# Patient Record
Sex: Female | Born: 1990 | Race: White | Hispanic: No | Marital: Single | Smoking: Never smoker
Health system: Southern US, Community
[De-identification: ages and names within clinical notes are randomized; demographics above are authoritative.]

## PROBLEM LIST (undated history)

## (undated) HISTORY — PX: APPENDECTOMY: SHX54

---

## 2017-08-19 ENCOUNTER — Encounter (HOSPITAL_COMMUNITY): Payer: Self-pay | Admitting: Emergency Medicine

## 2017-08-19 ENCOUNTER — Emergency Department (HOSPITAL_COMMUNITY): Payer: Commercial Managed Care - PPO

## 2017-08-19 ENCOUNTER — Emergency Department (HOSPITAL_COMMUNITY)
Admission: EM | Admit: 2017-08-19 | Discharge: 2017-08-19 | Disposition: A | Discharge status: Home / Self Care | Payer: Commercial Managed Care - PPO | Attending: Emergency Medicine | Admitting: Emergency Medicine

## 2017-08-19 DIAGNOSIS — N760 Acute vaginitis: Secondary | ICD-10-CM | POA: Diagnosis not present

## 2017-08-19 DIAGNOSIS — R102 Pelvic and perineal pain: Secondary | ICD-10-CM | POA: Diagnosis present

## 2017-08-19 DIAGNOSIS — Z79899 Other long term (current) drug therapy: Secondary | ICD-10-CM | POA: Diagnosis not present

## 2017-08-19 DIAGNOSIS — N898 Other specified noninflammatory disorders of vagina: Secondary | ICD-10-CM

## 2017-08-19 DIAGNOSIS — B9689 Other specified bacterial agents as the cause of diseases classified elsewhere: Secondary | ICD-10-CM

## 2017-08-19 LAB — CBC
HEMATOCRIT: 42.6 % (ref 36.0–46.0)
HEMOGLOBIN: 14.1 g/dL (ref 12.0–15.0)
MCH: 30.9 pg (ref 26.0–34.0)
MCHC: 33.1 g/dL (ref 30.0–36.0)
MCV: 93.2 fL (ref 78.0–100.0)
Platelets: 225 10*3/uL (ref 150–400)
RBC: 4.57 MIL/uL (ref 3.87–5.11)
RDW: 12.3 % (ref 11.5–15.5)
WBC: 12.2 10*3/uL — ABNORMAL HIGH (ref 4.0–10.5)

## 2017-08-19 LAB — URINALYSIS, ROUTINE W REFLEX MICROSCOPIC
Bilirubin Urine: NEGATIVE
Glucose, UA: NEGATIVE mg/dL
HGB URINE DIPSTICK: NEGATIVE
Ketones, ur: 20 mg/dL — AB
LEUKOCYTES UA: NEGATIVE
Nitrite: NEGATIVE
Protein, ur: NEGATIVE mg/dL
SPECIFIC GRAVITY, URINE: 1.016 (ref 1.005–1.030)
pH: 6 (ref 5.0–8.0)

## 2017-08-19 LAB — BASIC METABOLIC PANEL
ANION GAP: 12 (ref 5–15)
BUN: 7 mg/dL (ref 6–20)
CALCIUM: 9.6 mg/dL (ref 8.9–10.3)
CO2: 22 mmol/L (ref 22–32)
CREATININE: 0.67 mg/dL (ref 0.44–1.00)
Chloride: 105 mmol/L (ref 98–111)
GFR calc Af Amer: >60 mL/min (ref >60)
GFR calc non Af Amer: >60 mL/min (ref >60)
GLUCOSE: 95 mg/dL (ref 70–99)
Potassium: 3.6 mmol/L (ref 3.5–5.1)
Sodium: 139 mmol/L (ref 135–145)

## 2017-08-19 LAB — GC/CHLAMYDIA PROBE AMP (~~LOC~~) NOT AT ARMC
CHLAMYDIA, DNA PROBE: NEGATIVE
Neisseria Gonorrhea: NEGATIVE

## 2017-08-19 LAB — WET PREP, GENITAL
Sperm: NONE SEEN
TRICH WET PREP: NONE SEEN
Yeast Wet Prep HPF POC: NONE SEEN

## 2017-08-19 LAB — I-STAT BETA HCG BLOOD, ED (MC, WL, AP ONLY): I-stat hCG, quantitative: <5 m[IU]/mL (ref <5)

## 2017-08-19 MED ORDER — HYDROCODONE-ACETAMINOPHEN 5-325 MG PO TABS
2.0000 | ORAL_TABLET | Freq: Once | ORAL | Status: AC
  Administered 2017-08-19: 2 via ORAL
  Filled 2017-08-19: qty 2

## 2017-08-19 MED ORDER — DOXYCYCLINE HYCLATE 100 MG PO CAPS: 100.0000 mg | ORAL_CAPSULE | Freq: Two times a day (BID) | ORAL | 0 refills | Status: AC

## 2017-08-19 MED ORDER — ONDANSETRON HCL 4 MG/2ML IJ SOLN
4.0000 mg | Freq: Once | INTRAMUSCULAR | Status: AC
  Administered 2017-08-19: 4 mg via INTRAVENOUS
  Filled 2017-08-19: qty 2

## 2017-08-19 MED ORDER — METRONIDAZOLE 500 MG PO TABS: 500.0000 mg | ORAL_TABLET | Freq: Two times a day (BID) | ORAL | 0 refills | Status: AC

## 2017-08-19 MED ORDER — AZITHROMYCIN 250 MG PO TABS
1000.0000 mg | ORAL_TABLET | Freq: Once | ORAL | Status: AC
  Administered 2017-08-19: 1000 mg via ORAL
  Filled 2017-08-19: qty 4

## 2017-08-19 MED ORDER — MORPHINE SULFATE (PF) 4 MG/ML IV SOLN
4.0000 mg | Freq: Once | INTRAVENOUS | Status: AC
  Administered 2017-08-19: 4 mg via INTRAVENOUS
  Filled 2017-08-19: qty 1

## 2017-08-19 MED ORDER — CEFTRIAXONE SODIUM 250 MG IJ SOLR
250.0000 mg | Freq: Once | INTRAMUSCULAR | Status: AC
  Administered 2017-08-19: 250 mg via INTRAMUSCULAR
  Filled 2017-08-19: qty 250

## 2017-08-19 MED ORDER — LIDOCAINE HCL (PF) 1 % IJ SOLN
INTRAMUSCULAR | Status: AC
  Filled 2017-08-19: qty 5

## 2017-08-19 NOTE — ED Notes (Signed)
Patient transported to Ultrasound 

## 2017-08-19 NOTE — ED Triage Notes (Signed)
Pt reports pelvic pain X2 days worsening this morning, also reports nausea. Denies vag bleeding/DC. LMP 2wks ago

## 2017-08-19 NOTE — ED Provider Notes (Signed)
[facility] Provider Note   CSN: [id number] Arrival date & time: 08/19/17  0309     History   Chief Complaint Chief Complaint  Patient presents with  . Pelvic Pain    HPI Doreatha MartinCasey Wilson is a 27 y.o. female.  Patient presents to the emergency department with a chief complaint of pelvic pain and vaginal discharge.  She states that the symptoms have been gradually worsening over the past 2 days.  She denies any vaginal bleeding.  States her last menstrual period was 2 weeks ago.  She denies any fevers, chills, or vomiting.  She denies any dysuria or hematuria.  She has not taken anything for symptoms.  The history is provided by the patient. No language interpreter was used.    History reviewed. No pertinent past medical history.  There are no active problems to display for this patient.   Past Surgical History:  Procedure Laterality Date  . APPENDECTOMY       OB History   None      Home Medications    Prior to Admission medications   Medication Sig Start Date End Date Taking? Authorizing Provider  Acetaminophen-Caff-Pyrilamine (MIDOL COMPLETE) 500-60-15 MG TABS Take 1-2 tablets by mouth every 8 (eight) hours as needed (for pain or cramping).   Yes [provider]  albuterol (PROAIR HFA) 108 (90 Base) MCG/ACT inhaler Inhale 2 puffs into the lungs every 6 (six) hours as needed for wheezing or shortness of breath.   Yes [provider]    Family History No family history on file.  Social History Social History   Tobacco Use  . Smoking status: Never Smoker  . Smokeless tobacco: Never Used  Substance Use Topics  . Alcohol use: Not Currently  . Drug use: Yes    Types: Marijuana     Allergies   Patient has no known allergies.   Review of Systems Review of Systems  All other systems reviewed and are negative.    Physical Exam Updated Vital Signs BP 125/84 (BP Location: Left Arm)   Pulse 94    Temp 97.8 F (36.6 C) (Oral)   Resp 18   Ht 5\' 4"  (1.626 m)   Wt 49.9 kg (110 lb)   SpO2 98%   BMI 18.88 kg/m   Physical Exam  Constitutional: She is oriented to person, place, and time. She appears well-developed and well-nourished.  HENT:  Head: Normocephalic and atraumatic.  Eyes: Pupils are equal, round, and reactive to light. Conjunctivae and EOM are normal.  Neck: Normal range of motion. Neck supple.  Cardiovascular: Normal rate and regular rhythm. Exam reveals no gallop and no friction rub.  No murmur heard. Pulmonary/Chest: Effort normal and breath sounds normal. No respiratory distress. She has no wheezes. She has no rales. She exhibits no tenderness.  Abdominal: Soft. Bowel sounds are normal. She exhibits no distension and no mass. There is no tenderness. There is no rebound and no guarding.  Genitourinary:  Genitourinary Comments: Chaperone present for pelvic exam, performed by Adelina MingsKelsey, PA-S, moderate uterine tenderness with thick white vaginal discharge, no bleeding, no injury to the external genitalia  Musculoskeletal: Normal range of motion. She exhibits no edema or tenderness.  Neurological: She is alert and oriented to person, place, and time.  Skin: Skin is warm and dry.  Psychiatric: She has a normal mood and affect. Her behavior is normal. Judgment and thought content normal.  Nursing note and vitals reviewed.    ED  Treatments / Results  Labs (all labs ordered are listed, but only abnormal results are displayed) Labs Reviewed  WET PREP, GENITAL - Abnormal; Notable for the following components:      Result Value   Clue Cells Wet Prep HPF POC PRESENT (*)    WBC, Wet Prep HPF POC MANY (*)    All other components within normal limits  URINALYSIS, ROUTINE W REFLEX MICROSCOPIC - Abnormal; Notable for the following components:   Ketones, ur 20 (*)    All other components within normal limits  CBC - Abnormal; Notable for the following components:   WBC 12.2 (*)     All other components within normal limits  BASIC METABOLIC PANEL  I-STAT BETA HCG BLOOD, ED ([facility], [facility], [facility] ONLY)  GC/CHLAMYDIA PROBE AMP ([place]) NOT AT [facility]    EKG None  Radiology US Transvaginal Non-ob  Result Date: 08/19/2017 CLINICAL DATA:  27 year old female with generalized pelvic pain. EXAM: TRANSABDOMINAL AND TRANSVAGINAL ULTRASOUND OF PELVIS DOPPLER ULTRASOUND OF OVARIES TECHNIQUE: Both transabdominal and transvaginal ultrasound examinations of the pelvis were performed. Transabdominal technique was performed for global imaging of the pelvis including uterus, ovaries, adnexal regions, and pelvic cul-de-sac. It was necessary to proceed with endovaginal exam following the transabdominal exam to visualize the endometrium and the ovaries. Color and duplex Doppler ultrasound was utilized to evaluate blood flow to the ovaries. COMPARISON:  None. FINDINGS: Uterus Measurements: 8.5 x 3.6 x 4.8 cm. No fibroids or other mass visualized. Small amount of fluid noted within the endocervical canal. Endometrium Thickness: 11 mm.  No focal abnormality visualized. Right ovary Measurements: 3.6 x 2.6 x 2.7 cm. Normal appearance/no adnexal mass. Left ovary Measurements: 4.9 x 2.8 x 3.6 cm. Normal appearance/no adnexal mass. Pulsed Doppler evaluation of both ovaries demonstrates normal low-resistance arterial and venous waveforms. Other findings No abnormal free fluid. IMPRESSION: Unremarkable pelvic ultrasound. Doppler detected flow to both ovaries. Electronically Signed   By: Elgie Collard M.D.   On: 08/19/2017 06:56   US Pelvis Complete  Result Date: 08/19/2017 CLINICAL DATA:  27 year old female with generalized pelvic pain. EXAM: TRANSABDOMINAL AND TRANSVAGINAL ULTRASOUND OF PELVIS DOPPLER ULTRASOUND OF OVARIES TECHNIQUE: Both transabdominal and transvaginal ultrasound examinations of the pelvis were performed. Transabdominal technique was performed for global imaging of the pelvis including uterus,  ovaries, adnexal regions, and pelvic cul-de-sac. It was necessary to proceed with endovaginal exam following the transabdominal exam to visualize the endometrium and the ovaries. Color and duplex Doppler ultrasound was utilized to evaluate blood flow to the ovaries. COMPARISON:  None. FINDINGS: Uterus Measurements: 8.5 x 3.6 x 4.8 cm. No fibroids or other mass visualized. Small amount of fluid noted within the endocervical canal. Endometrium Thickness: 11 mm.  No focal abnormality visualized. Right ovary Measurements: 3.6 x 2.6 x 2.7 cm. Normal appearance/no adnexal mass. Left ovary Measurements: 4.9 x 2.8 x 3.6 cm. Normal appearance/no adnexal mass. Pulsed Doppler evaluation of both ovaries demonstrates normal low-resistance arterial and venous waveforms. Other findings No abnormal free fluid. IMPRESSION: Unremarkable pelvic ultrasound. Doppler detected flow to both ovaries. Electronically Signed   By: Elgie Collard M.D.   On: 08/19/2017 06:56   [place] Art/ven Flow Abd Pelv Doppler  Result Date: 08/19/2017 CLINICAL DATA:  27 year old female with generalized pelvic pain. EXAM: TRANSABDOMINAL AND TRANSVAGINAL ULTRASOUND OF PELVIS DOPPLER ULTRASOUND OF OVARIES TECHNIQUE: Both transabdominal and transvaginal ultrasound examinations of the pelvis were performed. Transabdominal technique was performed for global imaging of the pelvis including uterus, ovaries, adnexal regions, and  pelvic cul-de-sac. It was necessary to proceed with endovaginal exam following the transabdominal exam to visualize the endometrium and the ovaries. Color and duplex Doppler ultrasound was utilized to evaluate blood flow to the ovaries. COMPARISON:  None. FINDINGS: Uterus Measurements: 8.5 x 3.6 x 4.8 cm. No fibroids or other mass visualized. Small amount of fluid noted within the endocervical canal. Endometrium Thickness: 11 mm.  No focal abnormality visualized. Right ovary Measurements: 3.6 x 2.6 x 2.7 cm. Normal appearance/no adnexal  mass. Left ovary Measurements: 4.9 x 2.8 x 3.6 cm. Normal appearance/no adnexal mass. Pulsed Doppler evaluation of both ovaries demonstrates normal low-resistance arterial and venous waveforms. Other findings No abnormal free fluid. IMPRESSION: Unremarkable pelvic ultrasound. Doppler detected flow to both ovaries. Electronically Signed   By: Elgie Collard M.D.   On: 08/19/2017 06:56    Procedures Procedures (including critical care time)  Medications Ordered in ED Medications  HYDROcodone-acetaminophen (NORCO/VICODIN) 5-325 MG per tablet 2 tablet (has no administration in time range)  azithromycin (ZITHROMAX) tablet 1,000 mg (has no administration in time range)  cefTRIAXone (ROCEPHIN) injection 250 mg (has no administration in time range)  ondansetron (ZOFRAN) injection 4 mg (4 mg Intravenous Given 08/19/17 0531)  morphine 4 MG/ML injection 4 mg (4 mg Intravenous Given 08/19/17 0531)     Initial Impression / Assessment and Plan / ED Course  I have reviewed the triage vital signs and the nursing notes.  Pertinent labs & imaging results that were available during my care of the patient were reviewed by me and considered in my medical decision making (see chart for details).     She with pelvic pain and vaginal discharge.  Urinalysis negative.  Patient is not pregnant.  Mild leukocytosis.  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  Ultrasound is reassuring.  Will treat for PID.  Will discharge home with doxycycline.  Recommend OB/GYN follow-up.  Patient understands and agrees to plan.  She is stable and ready for discharge.  Final Clinical Impressions(s) / ED Diagnoses   Final diagnoses:  Pelvic pain in female  BV (bacterial vaginosis)  Vaginal discharge    ED Discharge Orders        Ordered    doxycycline (VIBRAMYCIN) 100 MG capsule  2 times daily     08/19/17 0702    metroNIDAZOLE (FLAGYL) 500 MG tablet  2 times daily     08/19/17 4098       Roxy Horseman, PA-C 08/19/17 0703      Zadie Rhine, MD 08/19/17 [phone]

## 2017-08-19 NOTE — ED Notes (Signed)
Returned from U/S

## 2019-03-18 IMAGING — US US ART/VEN ABD/PELV/SCROTUM DOPPLER LTD
1 series · 13 of 25 positions shown · non-contrast
Comparison: None.

CLINICAL DATA: 26-year-old female with generalized pelvic pain.

EXAM:
TRANSABDOMINAL AND TRANSVAGINAL ULTRASOUND OF PELVIS
DOPPLER ULTRASOUND OF OVARIES
TECHNIQUE: Both transabdominal and transvaginal ultrasound examinations of the
pelvis were performed. Transabdominal technique was performed for
global imaging of the pelvis including uterus, ovaries, adnexal
regions, and pelvic cul-de-sac.
It was necessary to proceed with endovaginal exam following the
transabdominal exam to visualize the endometrium and the ovaries..
Color and duplex Doppler ultrasound was utilized to evaluate blood
flow to the ovaries.

[Series 1: us art/ven abd/pelv/scrotum doppler ltd · 0.20mm/px · 13 of 65 slices shown]
[im 1/65]
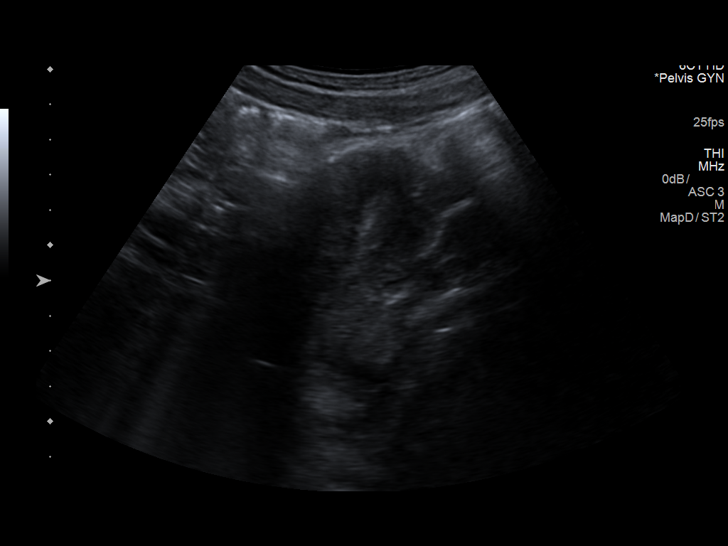
[im 6/65]
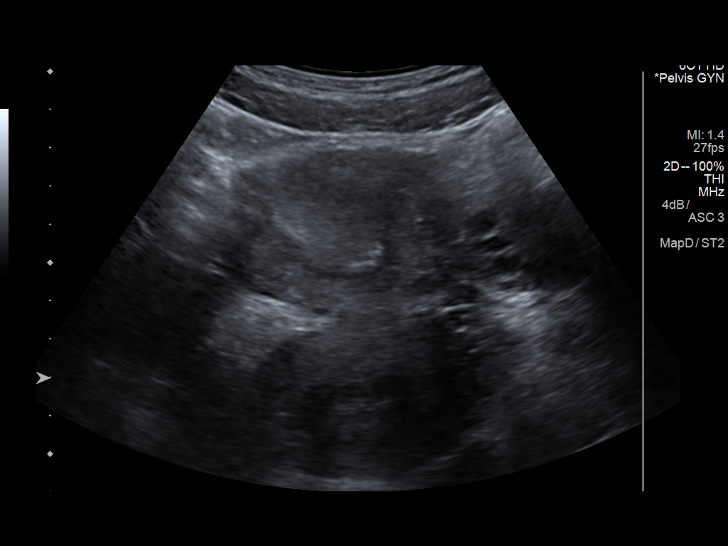
[im 11/65]
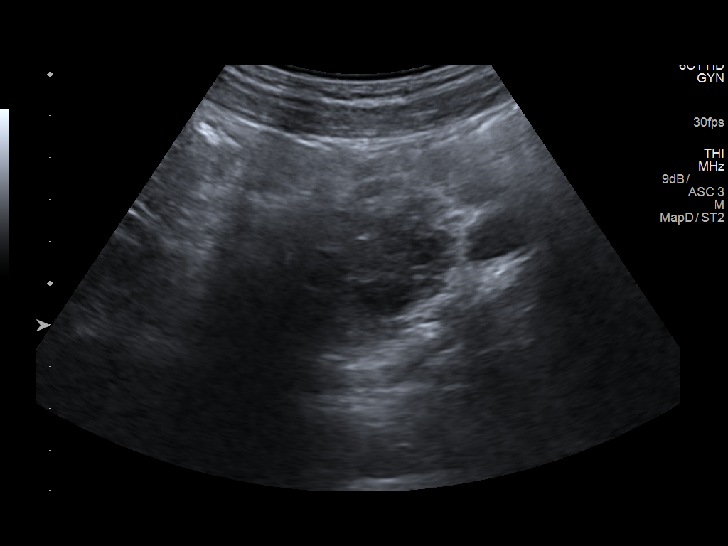
[im 17/65]
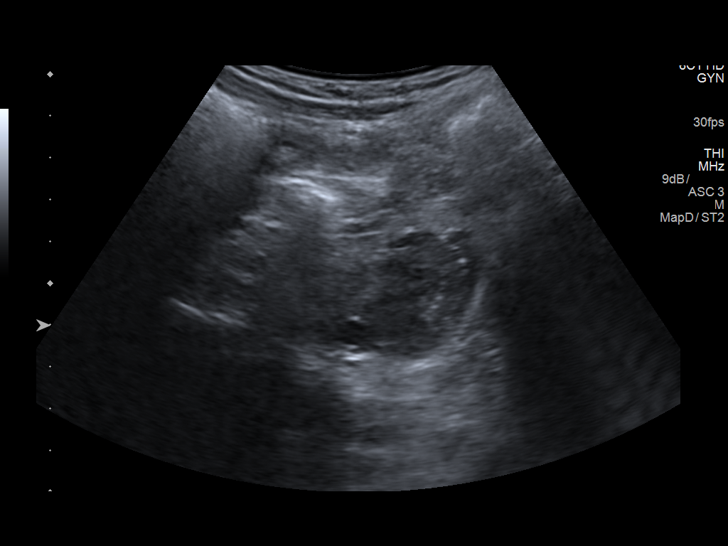
[im 22/65]
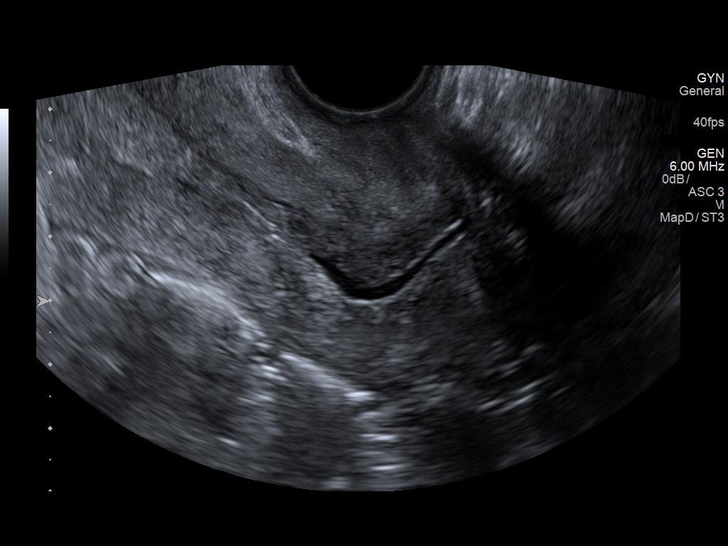
[im 27/65]
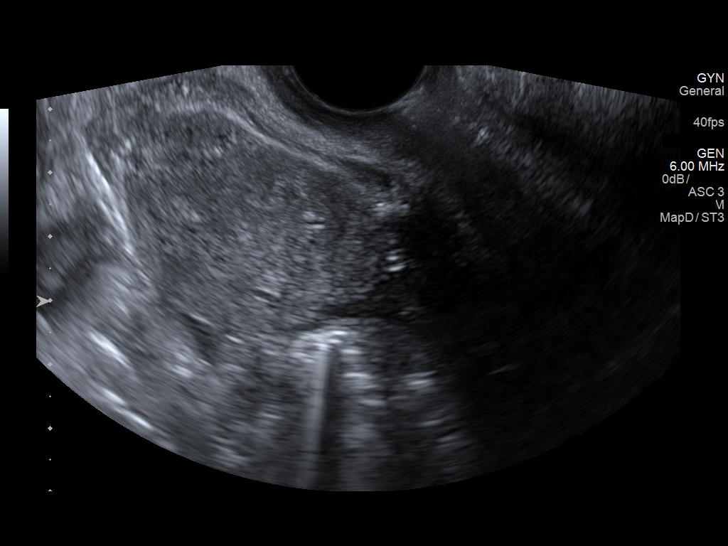
[im 33/65]
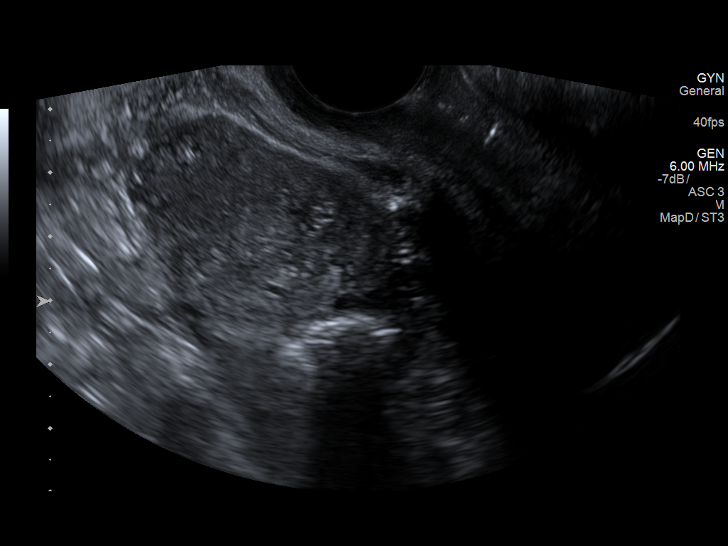
[im 38/65]
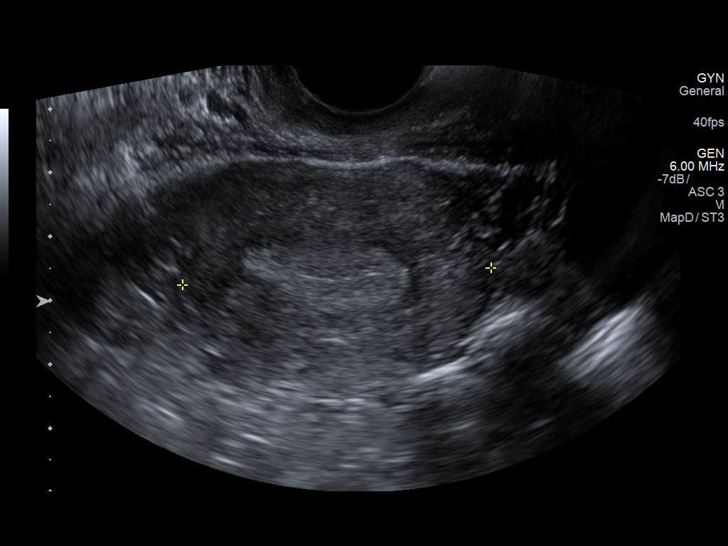
[im 43/65]
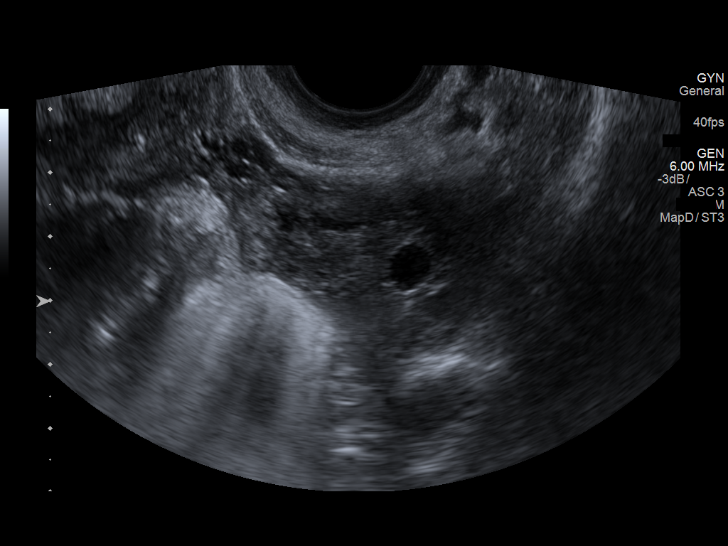
[im 49/65]
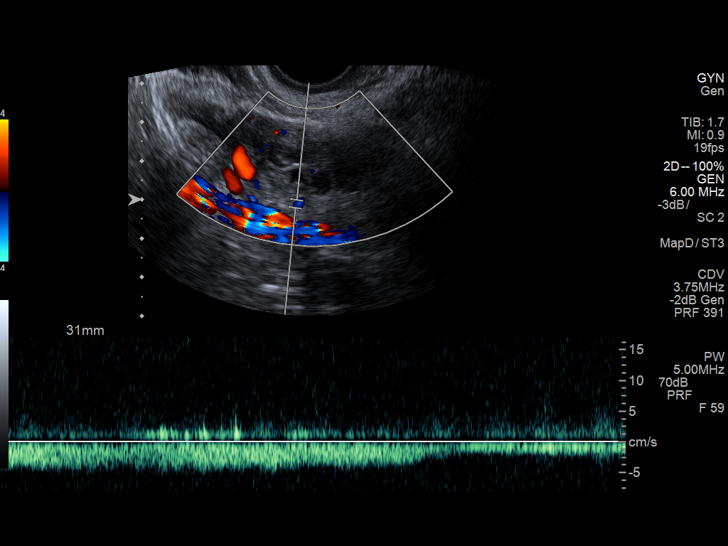
[im 54/65]
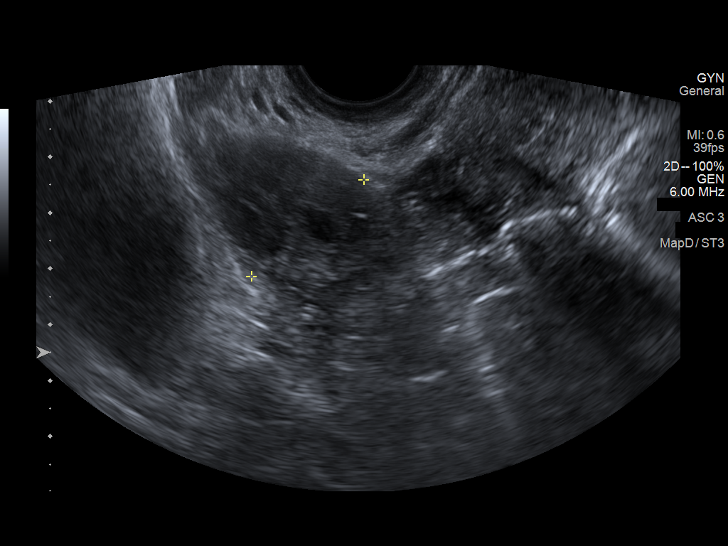
[im 59/65]
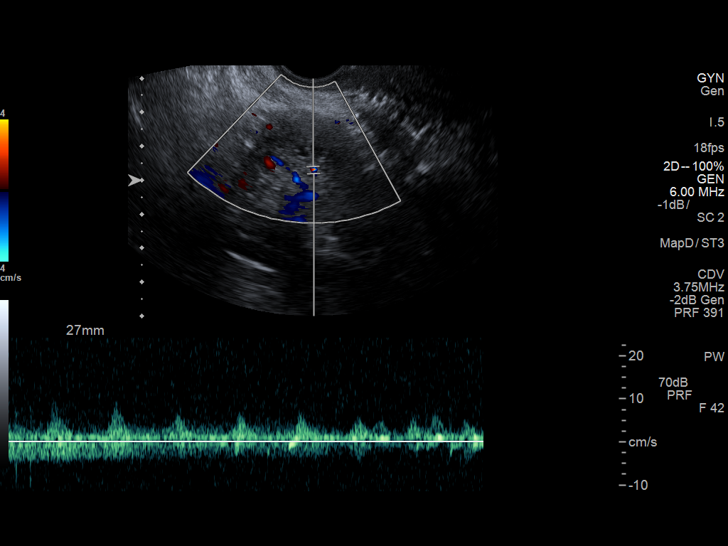
[im 65/65]
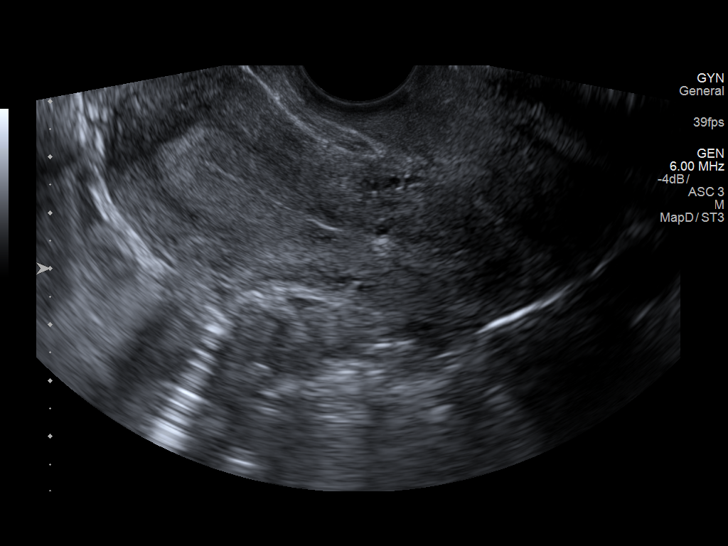

[13 of 25 positions shown; findings below may reference images not displayed]

FINDINGS: Uterus

Measurements: 8.5 x 3.6 x 4.8 cm. No fibroids or other mass
visualized. Small amount of fluid noted within the endocervical
canal.

Endometrium

Thickness: 11 mm.  No focal abnormality visualized.

Right ovary

Measurements: 3.6 x 2.6 x 2.7 cm. Normal appearance/no adnexal mass.

Left ovary

Measurements: 4.9 x 2.8 x 3.6 cm.. Normal appearance/no adnexal
mass.

Pulsed Doppler evaluation of both ovaries demonstrates normal
low-resistance arterial and venous waveforms.

Other findings

No abnormal free fluid.
IMPRESSION: Unremarkable pelvic ultrasound. Doppler detected flow to both
ovaries.
